# Patient Record
Sex: Female | Born: 2000 | Race: Black or African American | Hispanic: No | Marital: Single | Smoking: Never smoker
Health system: Southern US, Community
[De-identification: ages and names within clinical notes are randomized; demographics above are authoritative.]

## PROBLEM LIST (undated history)

## (undated) ENCOUNTER — Inpatient Hospital Stay (HOSPITAL_COMMUNITY): Payer: Self-pay

## (undated) DIAGNOSIS — Z789 Other specified health status: Secondary | ICD-10-CM

## (undated) HISTORY — DX: Other specified health status: Z78.9

---
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
## 2022-03-01 ENCOUNTER — Ambulatory Visit: Payer: Medicaid Other | Admitting: Obstetrics and Gynecology

## 2022-03-01 ENCOUNTER — Other Ambulatory Visit (HOSPITAL_COMMUNITY)
Admission: RE | Admit: 2022-03-01 | Discharge: 2022-03-01 | Disposition: A | Discharge status: Home / Self Care | Payer: Medicaid Other | Source: Ambulatory Visit | Attending: Obstetrics & Gynecology | Admitting: Obstetrics & Gynecology

## 2022-03-01 VITALS — BP 113/76 | HR 106 | Ht 64.0 in | Wt 111.7 lb

## 2022-03-01 DIAGNOSIS — Z113 Encounter for screening for infections with a predominantly sexual mode of transmission: Secondary | ICD-10-CM | POA: Diagnosis present

## 2022-03-01 DIAGNOSIS — N760 Acute vaginitis: Secondary | ICD-10-CM

## 2022-03-01 DIAGNOSIS — N898 Other specified noninflammatory disorders of vagina: Secondary | ICD-10-CM | POA: Insufficient documentation

## 2022-03-01 DIAGNOSIS — A749 Chlamydial infection, unspecified: Secondary | ICD-10-CM

## 2022-03-01 DIAGNOSIS — B379 Candidiasis, unspecified: Secondary | ICD-10-CM

## 2022-03-01 NOTE — Progress Notes (Signed)
Patient presents today due to yeast/ BV symptoms. She states symptoms include abnormal white thick vaginal discharge starting on 11/8. Self swab culture preformed. All questions asked, patient aware office will be in touch with results.

## 2022-03-03 LAB — CERVICOVAGINAL ANCILLARY ONLY
Bacterial Vaginitis (gardnerella): POSITIVE — AB
Candida Glabrata: NEGATIVE
Candida Vaginitis: POSITIVE — AB
Chlamydia: POSITIVE — AB
Comment: NEGATIVE
Comment: NEGATIVE
Comment: NEGATIVE
Comment: NEGATIVE
Comment: NEGATIVE
Comment: NORMAL
Neisseria Gonorrhea: NEGATIVE
Trichomonas: NEGATIVE

## 2022-03-03 NOTE — Progress Notes (Signed)
Can you pls notify pt and treat per protocols since I'm out of the office? Thx

## 2022-03-04 MED ORDER — FLUCONAZOLE 150 MG PO TABS: 150.0000 mg | ORAL_TABLET | Freq: Once | ORAL | 0 refills | Status: AC

## 2022-03-04 MED ORDER — METRONIDAZOLE 500 MG PO TABS: 500.0000 mg | ORAL_TABLET | Freq: Two times a day (BID) | ORAL | 0 refills | Status: DC

## 2022-03-04 MED ORDER — AZITHROMYCIN 500 MG PO TABS: 1000.0000 mg | ORAL_TABLET | Freq: Once | ORAL | 0 refills | Status: AC

## 2022-03-04 NOTE — Addendum Note (Signed)
Addended by: Drenda Freeze on: 03/04/2022 02:14 PM   Modules accepted: Orders

## 2022-03-09 ENCOUNTER — Telehealth: Payer: Self-pay

## 2022-03-09 NOTE — Telephone Encounter (Signed)
Patient is aware 

## 2022-03-09 NOTE — Telephone Encounter (Signed)
Pt calling to see if rx was sent to [facility] or [facility].  [phone]  Mailbox is full.  Rx went to [facility] on [address].

## 2022-06-24 ENCOUNTER — Ambulatory Visit: Payer: Medicaid Other

## 2022-06-30 ENCOUNTER — Other Ambulatory Visit (HOSPITAL_COMMUNITY)
Admission: RE | Admit: 2022-06-30 | Discharge: 2022-06-30 | Disposition: A | Discharge status: Home / Self Care | Payer: BC Managed Care – PPO | Source: Ambulatory Visit | Attending: Obstetrics & Gynecology | Admitting: Obstetrics & Gynecology

## 2022-06-30 ENCOUNTER — Ambulatory Visit: Payer: BC Managed Care – PPO

## 2022-06-30 VITALS — BP 110/70 | Ht 64.0 in | Wt 116.0 lb

## 2022-06-30 DIAGNOSIS — Z113 Encounter for screening for infections with a predominantly sexual mode of transmission: Secondary | ICD-10-CM

## 2022-06-30 NOTE — Progress Notes (Signed)
    NURSE VISIT NOTE  Subjective:    Patient ID: Pakou Rainbow, female    DOB: May 09, 2000, 22 y.o.   MRN: [id number]  HPI  Patient is a 22 y.o. G37P0010 female who presents for a nurse visit for self swab routine STD testing. Has had on and off pelvic pain for the past two weeks and a half. Has also had vaginal discharge, itching and fishy odor for two weeks and a half. Denies abnormal vaginal bleeding.  Objective:    BP 110/70   Ht 5\' 4"  (1.626 m)   Wt 116 lb (52.6 kg)   LMP 06/22/2022 (Exact Date)   BMI 19.91 kg/m    @THIS  VISIT ONLY@  Assessment:   1. Screening for STD (sexually transmitted disease)      Plan:   GC and chlamydia DNA  probe sent to lab. Treatment: Will wait for results to be treated if needed. ROV prn if symptoms persist or worsen.   Drenda Freeze, CMA

## 2022-07-02 LAB — CERVICOVAGINAL ANCILLARY ONLY
Bacterial Vaginitis (gardnerella): POSITIVE — AB
Candida Glabrata: NEGATIVE
Candida Vaginitis: NEGATIVE
Chlamydia: NEGATIVE
Comment: NEGATIVE
Comment: NEGATIVE
Comment: NEGATIVE
Comment: NEGATIVE
Comment: NEGATIVE
Comment: NORMAL
Neisseria Gonorrhea: NEGATIVE
Trichomonas: NEGATIVE

## 2022-07-04 ENCOUNTER — Other Ambulatory Visit: Payer: Self-pay | Admitting: Obstetrics and Gynecology

## 2022-07-04 MED ORDER — METRONIDAZOLE 500 MG PO TABS: ORAL_TABLET | ORAL | 0 refills | Status: DC

## 2022-07-04 NOTE — Progress Notes (Signed)
Rx flagyl for BV on culture/sx

## 2022-10-28 ENCOUNTER — Emergency Department
Admission: EM | Admit: 2022-10-28 | Discharge: 2022-10-28 | Disposition: A | Discharge status: Home / Self Care | Payer: Medicaid Other | Attending: Student in an Organized Health Care Education/Training Program | Admitting: Student in an Organized Health Care Education/Training Program

## 2022-10-28 ENCOUNTER — Ambulatory Visit: Payer: BC Managed Care – PPO

## 2022-10-28 ENCOUNTER — Other Ambulatory Visit: Payer: Self-pay

## 2022-10-28 ENCOUNTER — Encounter: Payer: Self-pay | Admitting: Emergency Medicine

## 2022-10-28 DIAGNOSIS — N946 Dysmenorrhea, unspecified: Secondary | ICD-10-CM

## 2022-10-28 DIAGNOSIS — R300 Dysuria: Secondary | ICD-10-CM

## 2022-10-28 DIAGNOSIS — R102 Pelvic and perineal pain: Secondary | ICD-10-CM | POA: Diagnosis present

## 2022-10-28 LAB — URINALYSIS, COMPLETE (UACMP) WITH MICROSCOPIC
Bilirubin Urine: NEGATIVE
Glucose, UA: NEGATIVE mg/dL
Ketones, ur: NEGATIVE mg/dL
Leukocytes,Ua: NEGATIVE
Nitrite: POSITIVE — AB
Protein, ur: 30 mg/dL — AB
RBC / HPF: >50 RBC/hpf (ref 0–5)
Specific Gravity, Urine: 1.026 (ref 1.005–1.030)
pH: 7 (ref 5.0–8.0)

## 2022-10-28 LAB — WET PREP, GENITAL
Clue Cells Wet Prep HPF POC: NONE SEEN
Sperm: NONE SEEN
Trich, Wet Prep: NONE SEEN
WBC, Wet Prep HPF POC: <10 (ref <10)
Yeast Wet Prep HPF POC: NONE SEEN

## 2022-10-28 LAB — CHLAMYDIA/NGC RT PCR (ARMC ONLY)
Chlamydia Tr: NOT DETECTED
N gonorrhoeae: NOT DETECTED

## 2022-10-28 LAB — POC URINE PREG, ED: Preg Test, Ur: NEGATIVE

## 2022-10-28 MED ORDER — SULFAMETHOXAZOLE-TRIMETHOPRIM 800-160 MG PO TABS: 1.0000 | ORAL_TABLET | Freq: Two times a day (BID) | ORAL | 0 refills | Status: AC

## 2022-10-28 NOTE — ED Triage Notes (Signed)
Patient to ED via POV for pelvic pain. Patient stating she wants STD testing- denies exposure. Also concerned because her period started earlier than normal.

## 2022-10-28 NOTE — ED Provider Notes (Signed)
[facility] Emergency Department Provider Note     Event Date/Time   First MD Initiated Contact with Patient 10/28/22 1306     (approximate)   History   Pelvic Pain   HPI  Kathleen Jenkins is a 22 y.o. female presents to the ED for evaluation of pelvic discomfort and pelvic pain.  Patient is also requesting STD exposure testing.  Patient voices concern because her period started earlier than expected.  She denies any abnormal vaginal bleeding and notes that she is currently on her menses.  Patient denies any other complaints at this time.  Physical Exam   Triage Vital Signs: ED Triage Vitals  Encounter Vitals Group     BP 10/28/22 1147 115/60     Systolic BP Percentile --      Diastolic BP Percentile --      Pulse Rate 10/28/22 1147 79     Resp 10/28/22 1147 18     Temp 10/28/22 1147 98.3 F (36.8 C)     Temp Source 10/28/22 1147 Oral     SpO2 10/28/22 1147 100 %     Weight --      Height --      Head Circumference --      Peak Flow --      Pain Score 10/28/22 1148 6     Pain Loc --      Pain Education --      Exclude from Growth Chart --     Most recent vital signs: Vitals:   10/28/22 1147  BP: 115/60  Pulse: 79  Resp: 18  Temp: 98.3 F (36.8 C)  SpO2: 100%    General Awake, no distress. NAD CV:  Good peripheral perfusion.  RESP:  Normal effort.  ABD:  No distention.  GU:  Normal external genitalia.  Patient with elongated cervix with some mild superior cervical ectopy. No CMT or adnexal masses appreciated.  ED Results / Procedures / Treatments   Labs (all labs ordered are listed, but only abnormal results are displayed) Labs Reviewed  URINALYSIS, COMPLETE (UACMP) WITH MICROSCOPIC - Abnormal; Notable for the following components:      Result Value   Color, Urine YELLOW (*)    APPearance HAZY (*)    Hgb urine dipstick MODERATE (*)    Protein, ur 30 (*)    Nitrite POSITIVE (*)    Bacteria, UA FEW (*)    All other  components within normal limits  WET PREP, GENITAL  CHLAMYDIA/NGC RT PCR ([facility] ONLY)            POC URINE PREG, ED     EKG    RADIOLOGY   PROCEDURES:  Critical Care performed: No  Procedures   MEDICATIONS ORDERED IN ED: Medications - No data to display   IMPRESSION / MDM / ASSESSMENT [place] PLAN / ED COURSE  I reviewed the triage vital signs and the nursing notes.                              Differential diagnosis includes, but is not limited to, yeast vaginitis, BV, trichomoniasis, gonorrhea, chlamydia, UTI, pregnancy, secondary amenorrhea  Patient's presentation is most consistent with acute complicated illness / injury requiring diagnostic workup.  Patient's diagnosis is consistent with dysmenorrhea likely due to normal menses as well as some mild dysuria.  Patient UA did reveal nitrite positive urine with mild bacteriuria. Patient will be discharged  home with prescriptions for Bactrim. Patient is to follow up with the [facility] department as needed or otherwise directed. Patient is given ED precautions to return to the ED for any worsening or new symptoms.     FINAL CLINICAL IMPRESSION(S) / ED DIAGNOSES   Final diagnoses:  Dysmenorrhea  Dysuria     Rx / DC Orders   ED Discharge Orders          Ordered    sulfamethoxazole-trimethoprim (BACTRIM DS) 800-160 MG tablet  2 times daily        10/28/22 1528             Note:  This document was prepared using Dragon voice recognition software and may include unintentional dictation errors.    Lissa Hoard, PA-C 10/28/22 1741    Willy Eddy, MD 10/30/22 1224

## 2022-10-28 NOTE — Discharge Instructions (Addendum)
Your exam and labs overall normal and reassuring.  You did have UA evidence of UTI.  Take the antibiotic as directed.  Follow-up with [facility] department for ongoing concerns.

## 2022-10-28 NOTE — Progress Notes (Deleted)
    NURSE VISIT NOTE  Subjective:    Patient ID: Kathleen Jenkins, female    DOB: [birth year], 22 y.o.   MRN: [id number]  HPI  Patient is a 22 y.o. G49P0010 female who presents for {pe vag discharge desc:315065} vaginal discharge for *** {gen duration:315003}. Denies abnormal vaginal bleeding or significant pelvic pain or fever. {Actions; denies/reports/admits to:19208} {UTI Symptoms:210800002}. Patient {has/denies:315300} history of known exposure to STD.   Objective:    There were no vitals taken for this visit.   @THIS  VISIT ONLY@  Assessment:   No diagnosis found.  {vaginitis type:315262}  Plan:   GC and chlamydia DNA  probe sent to lab. Treatment: {vaginitis tx:315263} ROV prn if symptoms persist or worsen.   Fonda Kinder, CMA

## 2022-11-05 ENCOUNTER — Ambulatory Visit: Payer: Medicaid Other | Admitting: Obstetrics

## 2022-11-05 DIAGNOSIS — Z01419 Encounter for gynecological examination (general) (routine) without abnormal findings: Secondary | ICD-10-CM

## 2022-12-05 IMAGING — CT CT ABD-PELV W/O CM
2 of 4 series · 16 of 46 positions shown, 18 images · non-contrast
Comparison: Ultrasound 09/23/2019

CLINICAL DATA: Acute lower abdominal pain.

EXAM:
CT ABDOMEN AND PELVIS WITHOUT CONTRAST
TECHNIQUE: Multidetector CT imaging of the abdomen and pelvis was performed
following the standard protocol without IV contrast.

[Series 2: routine abd/pel wo · axial · 0.62mm/px · z∈[-1071,-666]mm · 13 of 89 slices shown, 15 images]
[im 4/89  soft-tissue]
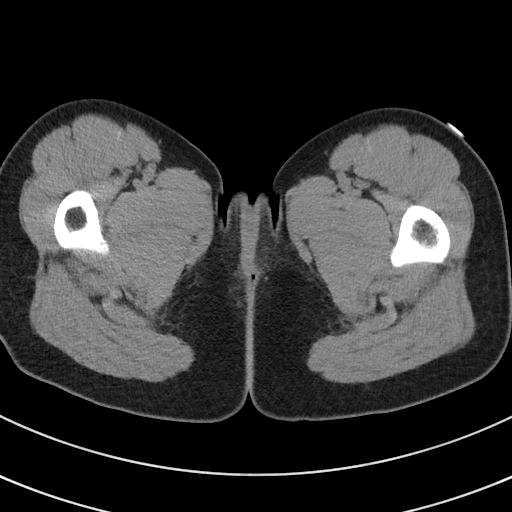
[im 4/89  bone]
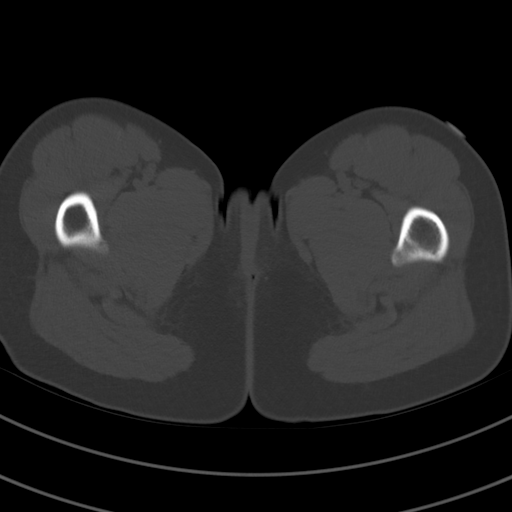
[im 12/89  soft-tissue]
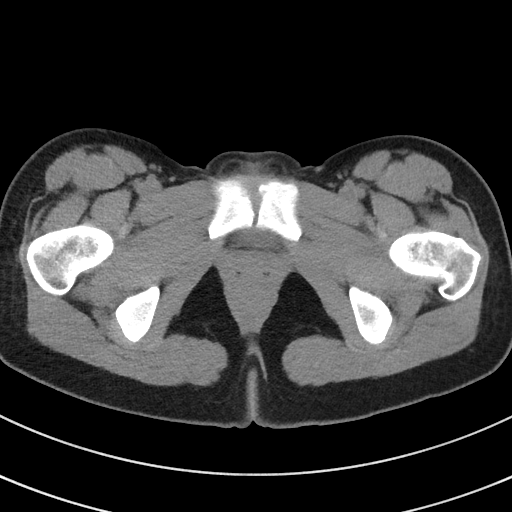
[im 19/89  soft-tissue]
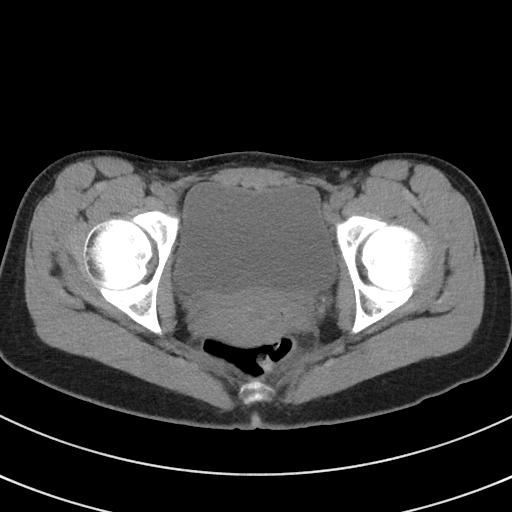
[im 26/89  soft-tissue]
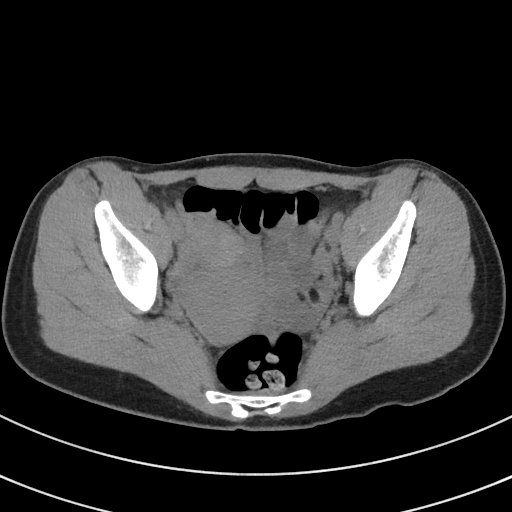
[im 30/89  soft-tissue]
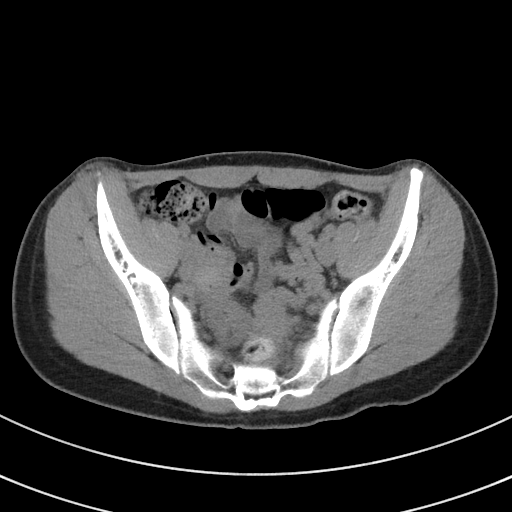
[im 37/89  soft-tissue]
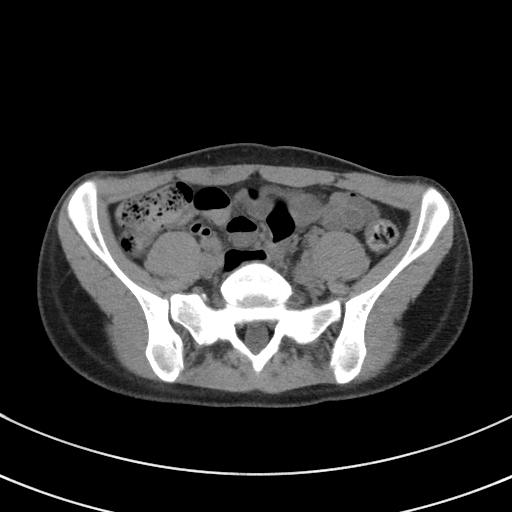
[im 45/89  soft-tissue]
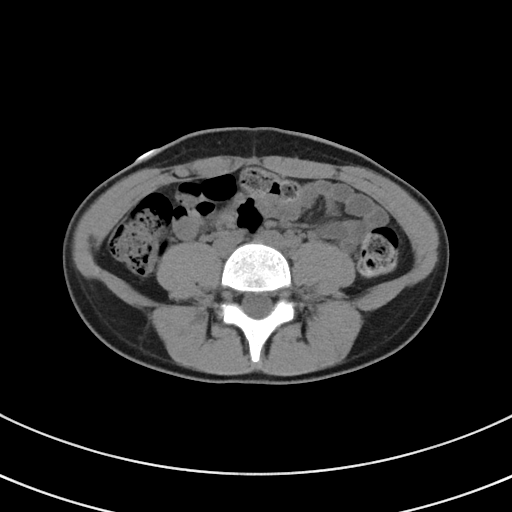
[im 52/89  soft-tissue]
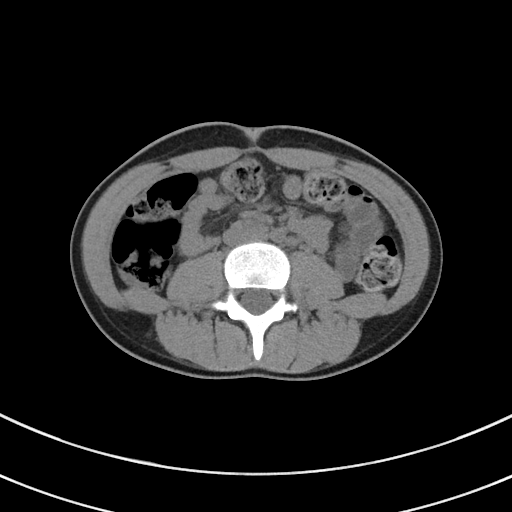
[im 59/89  soft-tissue]
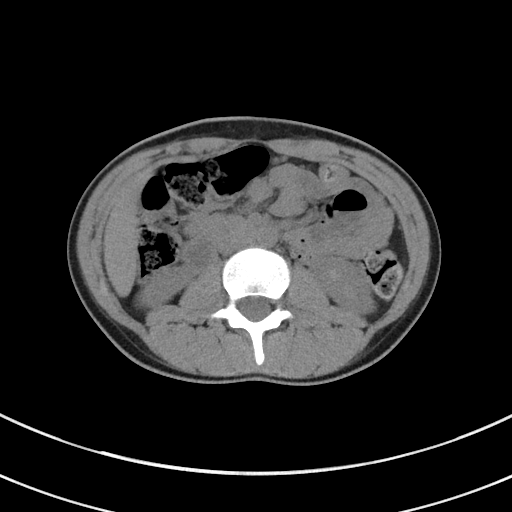
[im 59/89  bone]
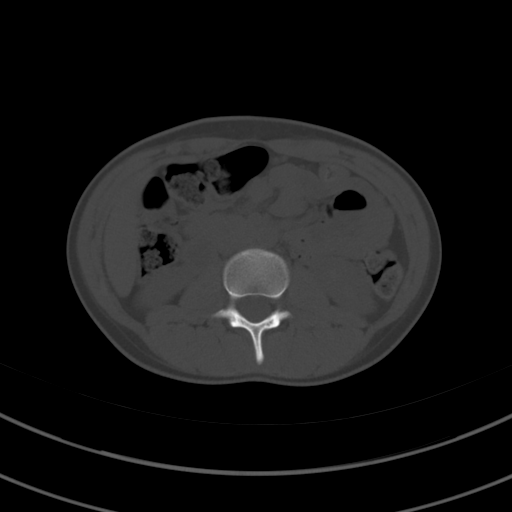
[im 63/89  soft-tissue]
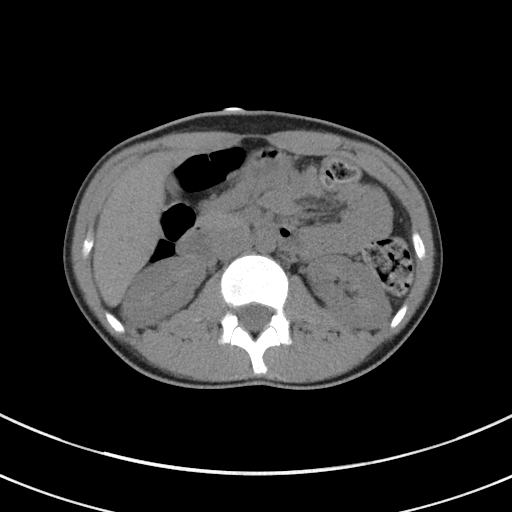
[im 70/89  soft-tissue]
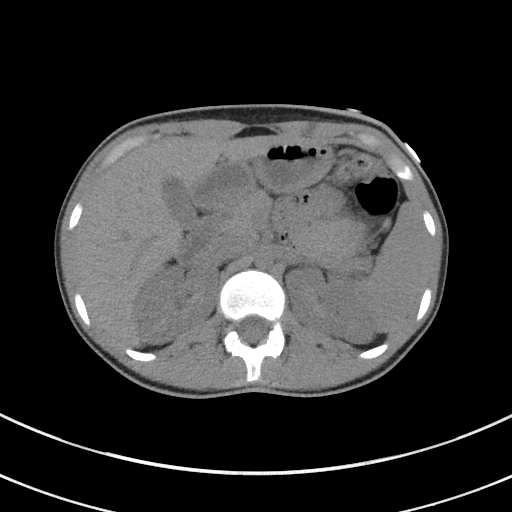
[im 78/89  soft-tissue]
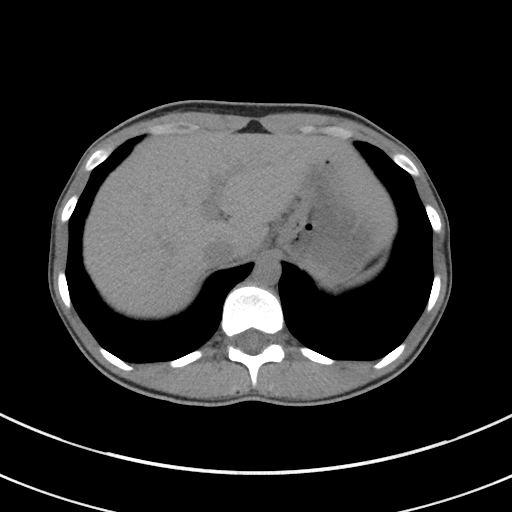
[im 85/89  soft-tissue]
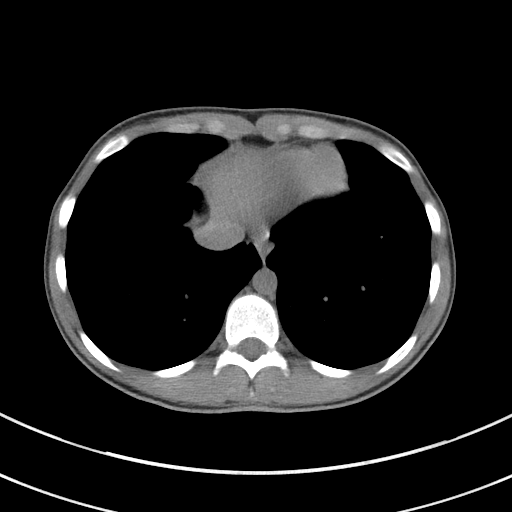

[Series 4: coronal st · coronal · 0.66mm/px · 3 of 59 slices shown]
[im 20/59  soft-tissue]
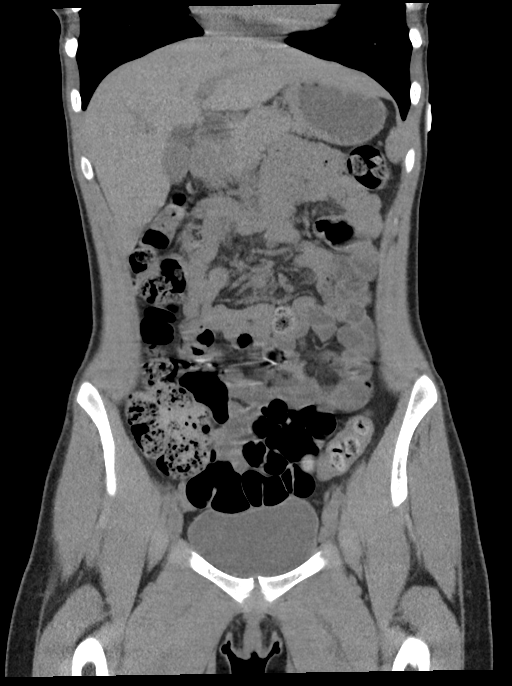
[im 26/59  soft-tissue]
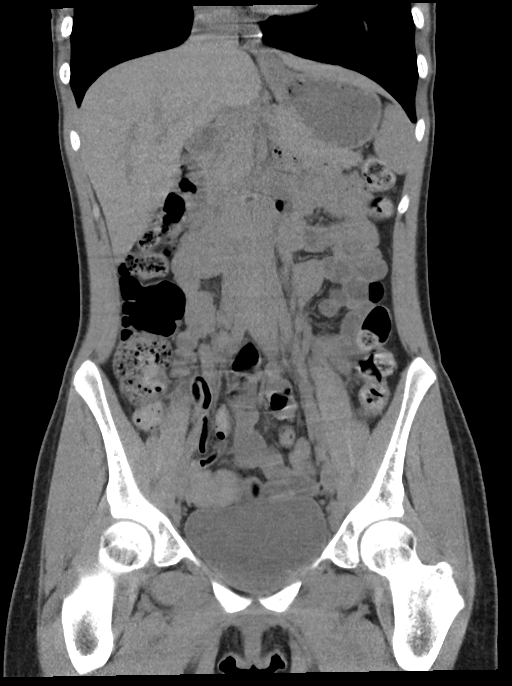
[im 33/59  soft-tissue]
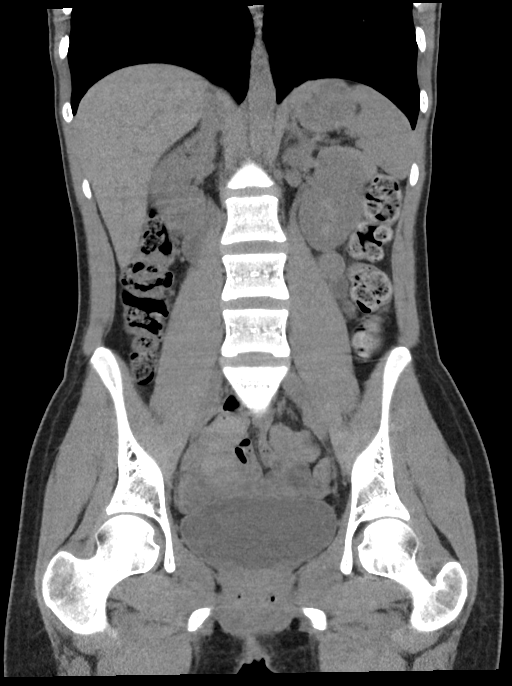

[16 of 46 positions shown; findings below may reference images not displayed]

FINDINGS: Lower chest: The lung bases are clear of acute process. No pleural
effusion or pulmonary lesions. The heart is normal in size. No
pericardial effusion. The distal esophagus and aorta are
unremarkable.

Hepatobiliary: No focal hepatic lesions or intrahepatic biliary
dilatation. The gallbladder is normal. No common bile duct
dilatation.

Pancreas: No mass, inflammation or ductal dilatation.

Spleen: Normal size.  No focal lesions.

Adrenals/Urinary Tract: The adrenal glands and kidneys are
unremarkable. No renal calculi or hydronephrosis. The bladder is
unremarkable.

Stomach/Bowel: The stomach, duodenum, small bowel and colon are
grossly normal without oral contrast. No inflammatory changes, mass
lesions or obstructive findings. The appendix is normal.

Vascular/Lymphatic: The aorta is normal in caliber. No
atheroscerlotic calcifications. No mesenteric of retroperitoneal
mass or adenopathy. Small scattered lymph nodes are noted.

Reproductive: The uterus is unremarkable.

The right ovary appears normal.

There is a complex left ovarian/adnexal lesion which contains simple
appearing fluid and also several fatty lobules. Findings suspicious
for ovarian dermoid. MRI examination may be helpful for further
evaluation if clinically necessary.

Other: No free pelvic fluid collections or pelvic abscess.

Musculoskeletal: The bony structures are unremarkable.
IMPRESSION: 1. Complex left ovarian/adnexal lesion suspicious for ovarian
dermoid. MRI may be helpful for further evaluation.
2. No acute abdominal/pelvic findings, other mass lesions or
adenopathy.

## 2022-12-11 ENCOUNTER — Emergency Department (HOSPITAL_COMMUNITY)
Admission: EM | Admit: 2022-12-11 | Discharge: 2022-12-11 | Disposition: A | Discharge status: Home / Self Care | Payer: Medicaid Other | Attending: Emergency Medicine | Admitting: Emergency Medicine

## 2022-12-11 ENCOUNTER — Emergency Department (HOSPITAL_COMMUNITY): Payer: Medicaid Other

## 2022-12-11 ENCOUNTER — Other Ambulatory Visit: Payer: Self-pay

## 2022-12-11 DIAGNOSIS — S01511A Laceration without foreign body of lip, initial encounter: Secondary | ICD-10-CM | POA: Diagnosis not present

## 2022-12-11 DIAGNOSIS — S0993XA Unspecified injury of face, initial encounter: Secondary | ICD-10-CM | POA: Diagnosis present

## 2022-12-11 DIAGNOSIS — R519 Headache, unspecified: Secondary | ICD-10-CM | POA: Diagnosis not present

## 2022-12-11 MED ORDER — MORPHINE SULFATE (PF) 4 MG/ML IV SOLN
4.0000 mg | Freq: Once | INTRAVENOUS | Status: AC
  Administered 2022-12-11: 4 mg via INTRAMUSCULAR
  Filled 2022-12-11: qty 1

## 2022-12-11 MED ORDER — OXYCODONE HCL 5 MG PO TABS
5.0000 mg | ORAL_TABLET | Freq: Four times a day (QID) | ORAL | 0 refills | Status: DC | PRN
Start: 2022-12-11 — End: 2022-12-31

## 2022-12-11 MED ORDER — LIDOCAINE HCL (PF) 1 % IJ SOLN
2.0000 mL | Freq: Once | INTRAMUSCULAR | Status: AC
  Administered 2022-12-11: 2 mL via INTRADERMAL
  Filled 2022-12-11: qty 5

## 2022-12-11 NOTE — Discharge Instructions (Addendum)
Take the pain medication as needed.  It will make you drowsy so do not take before driving or operating machinery.  You can alternate as needed with Motrin and Tylenol.  Keep your laceration clean. Eat soft foods while healing.  Rinse her mouth out after eating.  The sutures should dissolve.  Monitor for any signs of worsening including increased redness, swelling, drainage, or pain.  Follow-up with a dentist as soon as possible.  Call Dr. Mart Piggs office as soon as possible.  If they states you need an oral surgeon, additional oral surgeons are listed above.  Monitor for any signs of worsening including increased pain, drainage, loosening of the teeth.  Return to the ED for any worsening of symptoms or further concerns.
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
## 2022-12-31 ENCOUNTER — Encounter: Payer: Self-pay | Admitting: Obstetrics

## 2022-12-31 ENCOUNTER — Ambulatory Visit (INDEPENDENT_AMBULATORY_CARE_PROVIDER_SITE_OTHER): Payer: Medicaid Other | Admitting: Obstetrics

## 2022-12-31 ENCOUNTER — Other Ambulatory Visit (HOSPITAL_COMMUNITY)
Admission: RE | Admit: 2022-12-31 | Discharge: 2022-12-31 | Disposition: A | Discharge status: Home / Self Care | Payer: Medicaid Other | Source: Ambulatory Visit | Attending: Obstetrics | Admitting: Obstetrics

## 2022-12-31 VITALS — BP 117/81 | HR 72 | Ht 64.0 in | Wt 116.4 lb

## 2022-12-31 DIAGNOSIS — Z01419 Encounter for gynecological examination (general) (routine) without abnormal findings: Secondary | ICD-10-CM | POA: Diagnosis not present

## 2022-12-31 DIAGNOSIS — R399 Unspecified symptoms and signs involving the genitourinary system: Secondary | ICD-10-CM

## 2022-12-31 DIAGNOSIS — Z113 Encounter for screening for infections with a predominantly sexual mode of transmission: Secondary | ICD-10-CM | POA: Insufficient documentation

## 2022-12-31 LAB — POCT URINALYSIS DIPSTICK
Bilirubin, UA: NEGATIVE
Blood, UA: NEGATIVE
Glucose, UA: NEGATIVE
Ketones, UA: NEGATIVE
Leukocytes, UA: NEGATIVE
Nitrite, UA: NEGATIVE
Protein, UA: NEGATIVE
Spec Grav, UA: 1.025 (ref 1.010–1.025)
Urobilinogen, UA: 0.2 U/dL
pH, UA: 6.5 (ref 5.0–8.0)
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
## 2023-01-01 LAB — COMPREHENSIVE METABOLIC PANEL
ALT: 15 IU/L (ref 0–32)
AST: 15 IU/L (ref 0–40)
Albumin: 4 g/dL (ref 4.0–5.0)
Alkaline Phosphatase: 62 IU/L (ref 42–106)
BUN/Creatinine Ratio: 11 (ref 9–23)
BUN: 8 mg/dL (ref 6–20)
Bilirubin Total: 0.3 mg/dL (ref 0.0–1.2)
CO2: 23 mmol/L (ref 20–29)
Calcium: 9.2 mg/dL (ref 8.7–10.2)
Chloride: 108 mmol/L — ABNORMAL HIGH (ref 96–106)
Creatinine, Ser: 0.71 mg/dL (ref 0.57–1.00)
Globulin, Total: 2.7 g/dL (ref 1.5–4.5)
Glucose: 88 mg/dL (ref 70–99)
Potassium: 4.5 mmol/L (ref 3.5–5.2)
Sodium: 142 mmol/L (ref 134–144)
Total Protein: 6.7 g/dL (ref 6.0–8.5)
eGFR: 126 mL/min/{1.73_m2} (ref >59)

## 2023-01-01 LAB — HEPATITIS B SURFACE ANTIBODY,QUALITATIVE: Hep B Surface Ab, Qual: REACTIVE

## 2023-01-01 LAB — TSH: TSH: 0.835 u[IU]/mL (ref 0.450–4.500)

## 2023-01-01 LAB — HEMOGLOBIN A1C
Est. average glucose Bld gHb Est-mCnc: 103 mg/dL
Hgb A1c MFr Bld: 5.2 % (ref 4.8–5.6)

## 2023-01-01 LAB — CBC
Hematocrit: 34.8 % (ref 34.0–46.6)
Hemoglobin: 11 g/dL — ABNORMAL LOW (ref 11.1–15.9)
MCH: 29.4 pg (ref 26.6–33.0)
MCHC: 31.6 g/dL (ref 31.5–35.7)
MCV: 93 fL (ref 79–97)
Platelets: 376 10*3/uL (ref 150–450)
RBC: 3.74 x10E6/uL — ABNORMAL LOW (ref 3.77–5.28)
RDW: 12.4 % (ref 11.7–15.4)
WBC: 8 10*3/uL (ref 3.4–10.8)

## 2023-01-01 LAB — LIPID PANEL
Chol/HDL Ratio: 2.3 ratio (ref 0.0–4.4)
Cholesterol, Total: 146 mg/dL (ref 100–169)
HDL: 63 mg/dL (ref >39)
LDL Chol Calc (NIH): 72 mg/dL (ref 0–109)
Triglycerides: 48 mg/dL (ref 0–89)
VLDL Cholesterol Cal: 11 mg/dL (ref 5–40)

## 2023-01-01 LAB — HEPATITIS C ANTIBODY: Hep C Virus Ab: NONREACTIVE

## 2023-01-01 LAB — HEPATITIS B SURFACE ANTIGEN: Hepatitis B Surface Ag: NEGATIVE

## 2023-01-01 LAB — HIV ANTIBODY (ROUTINE TESTING W REFLEX): HIV Screen 4th Generation wRfx: NONREACTIVE

## 2023-01-01 LAB — RPR: RPR Ser Ql: NONREACTIVE

## 2023-01-03 ENCOUNTER — Other Ambulatory Visit: Payer: Self-pay | Admitting: Obstetrics

## 2023-01-03 ENCOUNTER — Encounter: Payer: Self-pay | Admitting: Obstetrics

## 2023-01-03 LAB — CERVICOVAGINAL ANCILLARY ONLY
Bacterial Vaginitis (gardnerella): POSITIVE — AB
Candida Glabrata: NEGATIVE
Candida Vaginitis: NEGATIVE
Chlamydia: NEGATIVE
Comment: NEGATIVE
Comment: NEGATIVE
Comment: NEGATIVE
Comment: NEGATIVE
Comment: NEGATIVE
Comment: NORMAL
Neisseria Gonorrhea: NEGATIVE
Trichomonas: POSITIVE — AB

## 2023-01-03 MED ORDER — METRONIDAZOLE 500 MG PO TABS: 500.0000 mg | ORAL_TABLET | Freq: Two times a day (BID) | ORAL | 0 refills | Status: DC

## 2023-01-03 NOTE — Progress Notes (Signed)
+  BV and trichomoniasis. Rx for metronidazole 500 mg PO BID x 7 days sent to pharmacy. [place] notified via MyChart.  M. Chryl Heck, CNM

## 2023-01-04 LAB — URINE CULTURE

## 2023-01-10 ENCOUNTER — Other Ambulatory Visit: Payer: Self-pay | Admitting: Obstetrics

## 2023-01-10 ENCOUNTER — Encounter: Payer: Self-pay | Admitting: Obstetrics

## 2023-01-10 MED ORDER — NITROFURANTOIN MONOHYD MACRO 100 MG PO CAPS: 100.0000 mg | ORAL_CAPSULE | Freq: Two times a day (BID) | ORAL | 0 refills | Status: DC

## 2023-01-24 NOTE — Telephone Encounter (Signed)
Patient called to inquire about taking macrobid and flagyl at the same time. Per Dr. Lonny Prude, it's ok, if  she prefers, she can take the macrobid first and then take the flagyl when macrobid is complete. Patient advised she has already taken both together. No further questions at this time.

## 2023-09-28 ENCOUNTER — Ambulatory Visit: Payer: Self-pay

## 2023-09-28 NOTE — Telephone Encounter (Signed)
 FYI Only or Action Required?: FYI only for provider  Patient was last seen in primary care on na Called Nurse Triage reporting Cough. Symptoms began a week ago. Interventions attempted: Nothing. Symptoms are: stable.  Triage Disposition: No disposition on file.  Patient/caregiver understands and will follow disposition?:     Copied from CRM [id number]. Topic: Clinical - Red Word Triage >> Sep 28, 2023  8:47 AM Kathleen Jenkins wrote: [facility] that prompted transfer to Nurse Triage:  Cough with mucous,  bumps in throat, difficulty swallowing Reason for Disposition  SEVERE coughing spells (e.g., whooping sound after coughing, vomiting after coughing)  Answer Assessment - Initial Assessment Questions 1. ONSET: When did the cough begin?      About a week ago 2. SEVERITY: How bad is the cough today?      severe 3. SPUTUM: Describe the color of your sputum (none, dry cough; clear, white, yellow, green)     Unknown, states swallows it 4. HEMOPTYSIS: Are you coughing up any blood? If so ask: How much? (flecks, streaks, tablespoons, etc.)     denies 5. DIFFICULTY BREATHING: Are you having difficulty breathing? If Yes, ask: How bad is it? (e.g., mild, moderate, severe)    - MILD: No SOB at rest, mild SOB with walking, speaks normally in sentences, can lie down, no retractions, pulse < 100.    - MODERATE: SOB at rest, SOB with minimal exertion and prefers to sit, cannot lie down flat, speaks in phrases, mild retractions, audible wheezing, pulse 100-120.    - SEVERE: Very SOB at rest, speaks in single words, struggling to breathe, sitting hunched forward, retractions, pulse > 120      denies 6. FEVER: Do you have a fever? If Yes, ask: What is your temperature, how was it measured, and when did it start?     no 7. CARDIAC HISTORY: Do you have any history of heart disease? (e.g., heart attack, congestive heart failure)      no 8. LUNG HISTORY: Do you have any history of lung disease?   (e.g., pulmonary embolus, asthma, emphysema)     no 9. PE RISK FACTORS: Do you have a history of blood clots? (or: recent major surgery, recent prolonged travel, bedridden)     no 10. OTHER SYMPTOMS: Do you have any other symptoms? (e.g., runny nose, wheezing, chest pain)       Sore throat trouble swallowing, itchy 11. PREGNANCY: Is there any chance you are pregnant? When was your last menstrual period?       na 12. TRAVEL: Have you traveled out of the country in the last month? (e.g., travel history, exposures)       no  Protocols used: Cough - Acute Productive-A-AH

## 2023-10-18 NOTE — Progress Notes (Deleted)
 Subjective:     Patient ID: Kathleen Jenkins, female    DOB: 09-20-2000, 23 y.o.   MRN: [id number]  No chief complaint on file.   HPI  Kathleen Jenkins is a 23 year old patient is presents for acute visit   UPPER RESPIRATORY INFECTION  Onset: ***  Course: *** Better with: *** Meds tried: *** Sick contacts: ***  Nasal discharge (color,laterality): ***  Cough: {YES/NO/WILD CARDS:18581} Productive cough: {YES/NO/WILD CARDS:18581}, Color: ***  Sinusitis Risk Factors Fever:    Headache/face pain: {YES/NO/WILD CARDS:18581}  Double sickening: {YES/NO/WILD CARDS:18581}  Tooth pain: {YES/NO/WILD CARDS:18581}   Allergy Risk Factors: Sneezing: {YES/NO/WILD CARDS:18581}  Itchy scratchy throat: {YES/NO/WILD CARDS:18581}  Seasonal sx: {YES/NO/WILD CARDS:18581}   Flu Risk Factors Headache: {YES/NO/WILD CARDS:18581}  Muscle aches: {YES/NO/WILD CARDS:18581}  Severe fatigue: {YES/NO/WILD CARDS:18581}    Red Flags  Stiff neck: {YES/NO/WILD CARDS:18581}  Dyspnea: {YES/NO/WILD CARDS:18581}  Rash: {YES/NO/WILD CARDS:18581}  Swallowing difficulty: {YES/NO/WILD RJMID:81418}    Patient denies fever, chills, SOB, CP, palpitations, dyspnea, edema, HA, vision changes, N/V/D, abdominal pain, urinary symptoms, rash, weight changes, and recent illness or hospitalizations.    History of Present Illness              Health Maintenance Due  Topic Date Due  . HPV VACCINES (1 - 3-dose series) Never done  . Meningococcal B Vaccine (1 of 2 - Standard) Never done  . DTaP/Tdap/Td (1 - Tdap) Never done  . Hepatitis B Vaccines (1 of 3 - 19+ 3-dose series) Never done  . COVID-19 Vaccine (1 - 2024-25 season) Never done    Past Medical History:  Diagnosis Date  . Antihistamines overdose 10/08/2020  . Chlamydia 08/12/2021  . No pertinent past medical history     Past Surgical History:  Procedure Laterality Date  . LAPAROSCOPIC OVARIAN CYSTECTOMY Left 10/30/2017   Procedure: LAPAROSCOPIC  OVARIAN CYSTECTOMY;  Surgeon: Lake Read, MD;  Location: [facility];  Service: Gynecology;  Laterality: Left;  . LAPAROSCOPIC UNILATERAL SALPINGECTOMY Left 10/30/2017   Procedure: LAPAROSCOPIC UNILATERAL SALPINGECTOMY;  Surgeon: Lake Read, MD;  Location: [facility];  Service: Gynecology;  Laterality: Left;    No family history on file.  Social History   Socioeconomic History  . Marital status: Single    Spouse name: Not on file  . Number of children: Not on file  . Years of education: Not on file  . Highest education level: Not on file  Occupational History  . Not on file  Tobacco Use  . Smoking status: Never  . Smokeless tobacco: Never  Vaping Use  . Vaping status: Every Day  Substance and Sexual Activity  . Alcohol use: No  . Drug use: Not Currently    Types: Marijuana  . Sexual activity: Yes    Birth control/protection: None  Other Topics Concern  . Not on file  Social History Narrative  . Not on file   Social Drivers of Health   Financial Resource Strain: Not on file  Food Insecurity: Not on file  Transportation Needs: Not on file  Physical Activity: Not on file  Stress: Not on file  Social Connections: Not on file  Intimate Partner Violence: Not on file    Outpatient Medications Prior to Visit  Medication Sig Dispense Refill  . nitrofurantoin , macrocrystal-monohydrate, (MACROBID ) 100 MG capsule Take 1 capsule (100 mg total) by mouth 2 (two) times daily. 10 capsule 0  . metroNIDAZOLE  (FLAGYL ) 500 MG tablet Take 1 tablet (500 mg total) by mouth 2 (two) times daily. 14  tablet 0   No facility-administered medications prior to visit.    No Known Allergies  ROS See HPI    Objective:    Physical Exam  HEENT: - Head: Normocephalic, atraumatic. - Eyes: Conjunctivae clear, no scleral icterus. - Ears: Tympanic membranes clear bilaterally, no bulging or erythema. - Nose: Nasal mucosa mildly erythematous, congested; no purulent discharge. - Throat:  Mucosa moist. ***Mild to moderate erythema of the posterior pharynx. ***No tonsillar exudate or swelling. Uvula midline. No oral ulcers. - Neck: Supple. No lymphadenopathy or neck stiffness. No thyromegaly or tenderness.  Respiratory: Lungs clear to auscultation bilaterally. No wheezing, rales, or rhonchi. No respiratory distress. Normal work of breathing.  Cardiovascular: Regular rate and rhythm. No murmurs, rubs, or gallops.  Skin: Warm, dry. No rash.  Neurological: Alert and oriented x3. No focal deficits.   There were no vitals taken for this visit. Wt Readings from Last 3 Encounters:  12/31/22 116 lb 6.4 oz (52.8 kg) (27%, Z= -0.63)*  06/30/22 116 lb (52.6 kg) (27%, Z= -0.61)*  03/01/22 111 lb 11.2 oz (50.7 kg) (20%, Z= -0.84)*   * Growth percentiles are based on CDC (Girls, 2-20 Years) data.       Assessment & Plan:   Problem List Items Addressed This Visit   None   I am having Kathleen Jenkins maintain her metroNIDAZOLE  and nitrofurantoin  (macrocrystal-monohydrate).  No orders of the defined types were placed in this encounter.

## 2023-10-19 ENCOUNTER — Ambulatory Visit: Admitting: Student

## 2024-02-05 NOTE — Progress Notes (Unsigned)
   PCP:  Pcp, No   No chief complaint on file.    HPI:      [name] is a 23 y.o. G1P0010 whose LMP was No LMP recorded., presents today for her annual examination.  Her menses are {norm/abn:715}, lasting {number: 22536} days.  Dysmenorrhea {dysmen:716}. She {does:18564} have intermenstrual bleeding.  Sex activity: {sex active: 315163}. No pain/bleeding/dryness. Last Pap: N/A due to age Hx of STDs: chlamydia, trichomonas in past Hx of recurrent BV  There is no FH of breast cancer. There is no FH of ovarian cancer. The patient {does:18564} do self-breast exams.  Tobacco use: {tob:20664} Alcohol use: {Alcohol:11675} No drug use.  Exercise: {exercise:31265}  She {does:18564} get adequate calcium and Vitamin D in her diet.  Patient Active Problem List   Diagnosis Date Noted   MDD (major depressive disorder), recurrent episode, severe (HCC) 10/14/2020   DMDD (disruptive mood dysregulation disorder) 10/09/2020   Severe recurrent major depression without psychotic features (HCC) 10/08/2020   BV (bacterial vaginosis) 06/19/2019    Past Surgical History:  Procedure Laterality Date   LAPAROSCOPIC OVARIAN CYSTECTOMY Left 10/30/2017   Procedure: LAPAROSCOPIC OVARIAN CYSTECTOMY;  Surgeon: Lake Read, MD;  Location: [facility];  Service: Gynecology;  Laterality: Left;   LAPAROSCOPIC UNILATERAL SALPINGECTOMY Left 10/30/2017   Procedure: LAPAROSCOPIC UNILATERAL SALPINGECTOMY;  Surgeon: Lake Read, MD;  Location: [facility];  Service: Gynecology;  Laterality: Left;    No family history on file.  Social History   Socioeconomic History   Marital status: Single    Spouse name: Not on file   Number of children: Not on file   Years of education: Not on file   Highest education level: Not on file  Occupational History   Not on file  Tobacco Use   Smoking status: Never   Smokeless tobacco: Never  Vaping Use   Vaping status: Every Day  Substance and Sexual Activity    Alcohol use: No   Drug use: Not Currently    Types: Marijuana   Sexual activity: Yes    Birth control/protection: None  Other Topics Concern   Not on file  Social History Narrative   Not on file   Social Drivers of Health   Financial Resource Strain: Not on file  Food Insecurity: Not on file  Transportation Needs: Not on file  Physical Activity: Not on file  Stress: Not on file  Social Connections: Not on file  Intimate Partner Violence: Not on file     Current Outpatient Medications:    nitrofurantoin , macrocrystal-monohydrate, (MACROBID ) 100 MG capsule, Take 1 capsule (100 mg total) by mouth 2 (two) times daily., Disp: 10 capsule, Rfl: 0   metroNIDAZOLE  (FLAGYL ) 500 MG tablet, Take 1 tablet (500 mg total) by mouth 2 (two) times daily., Disp: 14 tablet, Rfl: 0     ROS:  Review of Systems BREAST: No symptoms   Objective: There were no vitals taken for this visit.   OBGyn Exam  Results: No results found for this or any previous visit (from the past 24 hours).  Assessment/Plan: No diagnosis found.  No orders of the defined types were placed in this encounter.            GYN counsel {counseling: 16159}     F/U  No follow-ups on file.  Randie Tallarico B. Jaret Coppedge, PA-C 02/05/2024 7:40 PM

## 2024-02-06 ENCOUNTER — Ambulatory Visit (INDEPENDENT_AMBULATORY_CARE_PROVIDER_SITE_OTHER): Admitting: Obstetrics and Gynecology

## 2024-02-06 ENCOUNTER — Encounter: Payer: Self-pay | Admitting: Obstetrics and Gynecology

## 2024-02-06 VITALS — BP 106/71 | HR 96 | Ht 64.0 in | Wt 136.0 lb

## 2024-02-06 DIAGNOSIS — N898 Other specified noninflammatory disorders of vagina: Secondary | ICD-10-CM | POA: Diagnosis not present

## 2024-02-06 DIAGNOSIS — Z01419 Encounter for gynecological examination (general) (routine) without abnormal findings: Secondary | ICD-10-CM

## 2024-02-06 DIAGNOSIS — Z01411 Encounter for gynecological examination (general) (routine) with abnormal findings: Secondary | ICD-10-CM

## 2024-02-06 DIAGNOSIS — Z113 Encounter for screening for infections with a predominantly sexual mode of transmission: Secondary | ICD-10-CM

## 2024-02-06 LAB — POCT WET PREP WITH KOH
Clue Cells Wet Prep HPF POC: NEGATIVE
KOH Prep POC: NEGATIVE
Trichomonas, UA: NEGATIVE
Yeast Wet Prep HPF POC: NEGATIVE

## 2024-02-06 MED ORDER — FLUCONAZOLE 150 MG PO TABS: 150.0000 mg | ORAL_TABLET | Freq: Once | ORAL | 0 refills | Status: AC

## 2024-02-06 NOTE — Patient Instructions (Addendum)
I value your feedback and you entrusting us with your care. If you get a [facility] patient survey, I would appreciate you taking the time to let us know about your experience today. Thank you!  HEALTHY VAGINAL HYGIENE  AVOID   Panytyhose Synthetic underwear (wear COTTON underwear)  Tight pants/jeans Thongs Pantyliners Scented soaps/shower gels (use Dove Sensitive Skin soap or water to clean) Bubble bath/bath bombs Scented detergents  ALL dryer sheets (line dry underwear if using them on your other clothing) Feminine sprays/douches   FOR RECURRENT BACTERIAL VAGINOSIS (BV) Above recommendations and ADD probiotics daily, USE CONDOMS  Visit [email or web address]    

## 2024-02-09 ENCOUNTER — Ambulatory Visit: Payer: Self-pay | Admitting: Obstetrics and Gynecology

## 2024-02-09 LAB — NUSWAB VAGINITIS PLUS (VG+)
Candida albicans, NAA: POSITIVE — AB
Candida glabrata, NAA: NEGATIVE
Chlamydia trachomatis, NAA: NEGATIVE
Neisseria gonorrhoeae, NAA: NEGATIVE
Trich vag by NAA: NEGATIVE

## 2024-02-19 ENCOUNTER — Emergency Department
Admission: EM | Admit: 2024-02-19 | Discharge: 2024-02-19 | Disposition: A | Discharge status: Home / Self Care | Attending: Emergency Medicine | Admitting: Emergency Medicine

## 2024-02-19 DIAGNOSIS — Z3201 Encounter for pregnancy test, result positive: Secondary | ICD-10-CM | POA: Diagnosis present

## 2024-02-19 LAB — POC URINE PREG, ED: Preg Test, Ur: POSITIVE — AB

## 2024-02-19 NOTE — ED Triage Notes (Signed)
 Pt presents to the ED via POV from home.Pt reports first day of LMP 01/23/2023. Pt states that she took a positive pregnancy test today and wanted to get a US  for confirmation. Pt denies bleeding.

## 2024-02-19 NOTE — ED Provider Notes (Signed)
 [facility] Provider Note    Event Date/Time   First MD Initiated Contact with Patient 02/19/24 1657     (approximate)   History   Chief Complaint Possible Pregnancy   HPI  Kathleen Jenkins is a 23 y.o. female with past medical history of ectopic pregnancy who presents to the ED complaining of possible pregnancy.  Patient reports that she had a positive pregnancy test earlier today and wanted to come to the ED to confirm.  She is primarily concerned because of her history of ectopic pregnancy requiring surgery in 2018, but she denies any abdominal pain or vaginal bleeding today.  She has been feeling well and states her LMP was on October 6.  She was unable to reach her OB/GYN's office today because they are closed for the weekend.     Physical Exam   Triage Vital Signs: ED Triage Vitals  Encounter Vitals Group     BP 02/19/24 1612 (!) 140/91     Girls Systolic BP Percentile --      Girls Diastolic BP Percentile --      Boys Systolic BP Percentile --      Boys Diastolic BP Percentile --      Pulse Rate 02/19/24 1612 (!) 115     Resp 02/19/24 1612 18     Temp 02/19/24 1612 97.8 F (36.6 C)     Temp Source 02/19/24 1612 Oral     SpO2 02/19/24 1612 98 %     Weight 02/19/24 1613 136 lb (61.7 kg)     Height 02/19/24 1613 5' 4 (1.626 m)     Head Circumference --      Peak Flow --      Pain Score 02/19/24 1613 0     Pain Loc --      Pain Education --      Exclude from Growth Chart --     Most recent vital signs: Vitals:   02/19/24 1612 02/19/24 1713  BP: (!) 140/91   Pulse: (!) 115   Resp: 18   Temp: 97.8 F (36.6 C)   SpO2: 98% 98%    Constitutional: Alert and oriented. Eyes: Conjunctivae are normal. Head: Atraumatic. Nose: No congestion/rhinnorhea. Mouth/Throat: Mucous membranes are moist.  Cardiovascular: Normal rate, regular rhythm. Grossly normal heart sounds.  2+ radial pulses bilaterally. Respiratory: Normal respiratory effort.   No retractions. Lungs CTAB. Gastrointestinal: Soft and nontender. No distention. Musculoskeletal: No lower extremity tenderness nor edema.  Neurologic:  Normal speech and language. No gross focal neurologic deficits are appreciated.    ED Results / Procedures / Treatments   Labs (all labs ordered are listed, but only abnormal results are displayed) Labs Reviewed  POC URINE PREG, ED - Abnormal; Notable for the following components:      Result Value   Preg Test, Ur POSITIVE (*)    All other components within normal limits    PROCEDURES:  Critical Care performed: No  Procedures   MEDICATIONS ORDERED IN ED: Medications - No data to display   IMPRESSION / MDM / ASSESSMENT AND PLAN / ED COURSE  I reviewed the triage vital signs and the nursing notes.                              23 y.o. female G58P0010 with past medical history of ectopic pregnancy who presents to the ED complaining of positive pregnancy test at home.  Patient's  presentation is most consistent with acute, uncomplicated illness.  Differential diagnosis includes, but is not limited to, early pregnancy, IUP, ectopic pregnancy.  Patient well-appearing and in no acute distress, vital signs are unremarkable.  She currently denies any complaints, states she is only here to make sure everything is okay given her history of ectopic pregnancy.  Given benign abdominal exam with no vaginal bleeding, no indication for pelvic ultrasound at this time.  We did confirm positive pregnancy test and patient is appropriate for outpatient follow-up with OB/GYN.  She was counseled to return to the ED if she were to develop any pain, bleeding, or any other concerning symptoms.  Patient agrees with plan.      FINAL CLINICAL IMPRESSION(S) / ED DIAGNOSES   Final diagnoses:  Pregnancy confirmed by positive urine test     Rx / DC Orders   ED Discharge Orders     None        Note:  This document was prepared using Dragon  voice recognition software and may include unintentional dictation errors.   Willo Dunnings, MD 02/19/24 1714

## 2024-02-23 ENCOUNTER — Other Ambulatory Visit (HOSPITAL_COMMUNITY)
Admission: RE | Admit: 2024-02-23 | Discharge: 2024-02-23 | Disposition: A | Discharge status: Home / Self Care | Source: Ambulatory Visit | Attending: Certified Nurse Midwife | Admitting: Certified Nurse Midwife

## 2024-02-23 ENCOUNTER — Ambulatory Visit (INDEPENDENT_AMBULATORY_CARE_PROVIDER_SITE_OTHER)

## 2024-02-23 VITALS — BP 118/79 | HR 101 | Wt 136.4 lb

## 2024-02-23 DIAGNOSIS — N898 Other specified noninflammatory disorders of vagina: Secondary | ICD-10-CM

## 2024-02-23 NOTE — Progress Notes (Signed)
    NURSE VISIT NOTE  Subjective:    Patient ID: Fallon Haecker, female    DOB: 08-11-2000, 23 y.o.   MRN: [id number]  HPI  Patient is a 23 y.o. G83P0010 female who presents for foul vaginal discharge for 2 day(s). Has significant pelvic, patient states that she is pregnant. denies dysuria. Patient has a history of known exposure to STD.   Objective:    BP 118/79   Pulse (!) 101   Wt 136 lb 6.4 oz (61.9 kg)   LMP 01/23/2024 (Exact Date)   BMI 23.41 kg/m    No results found for any visits on 02/23/24.  Assessment:   1. Vaginal odor     rule out GC or chlamydia  Plan:   GC and chlamydia DNA  probe sent to lab. Treatment: abstain from coitus during course of treatment ROV prn if symptoms persist or worsen.   Mathis LITTIE Getting, CMA

## 2024-02-24 ENCOUNTER — Other Ambulatory Visit: Payer: Self-pay

## 2024-02-24 LAB — CERVICOVAGINAL ANCILLARY ONLY
Bacterial Vaginitis (gardnerella): NEGATIVE
Candida Glabrata: NEGATIVE
Candida Vaginitis: POSITIVE — AB
Chlamydia: NEGATIVE
Comment: NEGATIVE
Comment: NEGATIVE
Comment: NEGATIVE
Comment: NEGATIVE
Comment: NEGATIVE
Comment: NORMAL
Neisseria Gonorrhea: NEGATIVE
Trichomonas: NEGATIVE

## 2024-02-24 MED ORDER — FLUCONAZOLE 150 MG PO TABS: 150.0000 mg | ORAL_TABLET | Freq: Once | ORAL | 0 refills | Status: AC

## 2024-02-27 ENCOUNTER — Encounter: Payer: Self-pay | Admitting: Certified Nurse Midwife

## 2024-02-27 ENCOUNTER — Other Ambulatory Visit: Payer: Self-pay | Admitting: Certified Nurse Midwife

## 2024-02-27 MED ORDER — FLUCONAZOLE 150 MG PO TABS: 150.0000 mg | ORAL_TABLET | Freq: Once | ORAL | 1 refills | Status: AC

## 2024-03-19 ENCOUNTER — Telehealth: Payer: Self-pay | Admitting: Certified Nurse Midwife

## 2024-03-19 ENCOUNTER — Telehealth

## 2024-03-19 NOTE — Telephone Encounter (Signed)
 Reached out to pt to reschedule NOB Nurse Intake appt that was scheduled on 03/19/2024 at 3:15.  Could not leave a message bc call could not be completed.

## 2024-03-20 ENCOUNTER — Encounter: Payer: Self-pay | Admitting: Certified Nurse Midwife

## 2024-03-20 NOTE — Telephone Encounter (Signed)
 Reached out to pt (2x) to reschedule NOB Nurse Intake appt that was scheduled on 03/19/2024 at 3:15.  Could not leave a message bc call could not be completed.  Called both listed numbers.  Will send a MyChart letter to pt.

## 2024-04-02 ENCOUNTER — Other Ambulatory Visit

## 2024-04-13 ENCOUNTER — Ambulatory Visit

## 2024-04-13 ENCOUNTER — Other Ambulatory Visit: Payer: Self-pay

## 2024-04-13 VITALS — BP 115/71 | HR 88 | Ht 64.0 in | Wt 133.0 lb

## 2024-04-13 DIAGNOSIS — Z3A11 11 weeks gestation of pregnancy: Secondary | ICD-10-CM | POA: Diagnosis not present

## 2024-04-13 DIAGNOSIS — O3680X0 Pregnancy with inconclusive fetal viability, not applicable or unspecified: Secondary | ICD-10-CM | POA: Diagnosis not present

## 2024-04-13 DIAGNOSIS — Z3480 Encounter for supervision of other normal pregnancy, unspecified trimester: Secondary | ICD-10-CM

## 2024-04-13 NOTE — Progress Notes (Signed)
 Dating scan order placed for 1015 U/S today.

## 2024-04-13 NOTE — Progress Notes (Signed)
 New OB Intake  I explained I am completing New OB Intake today. We discussed her EDD of 10/29/24 that is based on LMP of 01/23/24. Pt is G2/P0010. I reviewed her allergies, medications, Medical/Surgical/OB history, and appropriate screenings. There are cats in the home: no. Based on history, this is a/an pregnancy uncomplicated . Her obstetrical history is significant for NA.  Patient Active Problem List   Diagnosis Date Noted   Supervision of other normal pregnancy, antepartum 04/13/2024    Concerns addressed today: No Concern   Delivery Plans:  Plans to deliver at [facility].  Anatomy US  Explained first scheduled US  will be 04/13/24. Anatomy US  will be scheduled around [redacted] weeks gestational age.  Labs Discussed genetic screening with patient. Patient consents to genetic testing to be drawn at new OB visit. Discussed possible labs to be drawn at new OB appointment.  COVID Vaccine Patient has not had COVID vaccine.   Social Determinants of Health Food Insecurity: denies food insecurity WIC Referral: Patient is interested in referral to [facility].  Transportation: Patient denies transportation needs. Childcare: Discussed no children allowed at ultrasound appointments.   First visit review I reviewed new OB appt with pt. I explained she will have blood work and pap smear/pelvic exam if indicated. Explained pt will be seen by Slater Rains, CNM at first visit; encounter routed to appropriate provider.   Mathis LITTIE Getting, CMA 04/13/2024  11:19 AM

## 2024-04-13 NOTE — Patient Instructions (Signed)
 First Trimester of Pregnancy  The first trimester of pregnancy starts on the first day of your last monthly period until the end of week 13. This is months 1 through 3 of pregnancy. A week after a sperm fertilizes an egg, the egg will implant into the wall of the uterus and begin to develop into a baby. Body changes during your first trimester Your body goes through many changes during pregnancy. The changes usually return to normal after your baby is born. Physical changes Your breasts may grow larger and may hurt. The area around your nipples may get darker. Your periods will stop. Your hair and nails may grow faster. You may pee more often. Health changes You may tire easily. Your gums may bleed and may be sensitive when you brush and floss. You may not feel hungry. You may have heartburn. You may throw up or feel like you may throw up. You may want to eat some foods, but not others. You may have headaches. You may have trouble pooping (constipation). Other changes Your emotions may change from day to day. You may have more dreams. Follow these instructions at home: Medicines Talk to your health care provider if you're taking medicines. Ask if the medicines are safe to take during pregnancy. Your provider may change the medicines that you take. Do not take any medicines unless told to by your provider. Take a prenatal vitamin that has at least 600 micrograms (mcg) of folic acid. Do not use herbal medicines, illegal substances, or medicines that are not approved by your provider. Eating and drinking While you're pregnant your body needs extra food for your growing baby. Talk with your provider about what to eat while pregnant. Activity Most women are able to exercise during pregnancy. Exercises may need to change as your pregnancy goes on. Talk to your provider about your activities and exercise routines. Relieving pain and discomfort Wear a good, supportive bra if your breasts  hurt. Rest with your legs raised if you have leg cramps or low back pain. Safety Wear your seatbelt at all times when you're in a car. Talk to your provider if someone hits you, hurts you, or yells at you. Talk with your provider if you're feeling sad or have thoughts of hurting yourself. Lifestyle Certain things can be harmful while you're pregnant. Follow these rules: Do not use hot tubs, steam rooms, or saunas. Do not douche. Do not use tampons or scented pads. Do not drink alcohol,smoke, vape, or use products with nicotine or tobacco in them. If you need help quitting, talk with your provider. Avoid cat litter boxes and soil used by cats. These things carry germs that can cause harm to your pregnancy and your baby. General instructions Keep all follow-up visits. It helps you and your unborn baby stay as healthy as possible. Write down your questions. Take them to your visits. Your provider will: Talk with you about your overall health. Give you advice or refer you to specialists who can help with different needs, including: Prenatal education classes. Mental health and counseling. Foods and healthy eating. Ask for help if you need help with food. Call your dentist and ask to be seen. Brush your teeth with a soft toothbrush. Floss gently. Where to find more information American Pregnancy Association: americanpregnancy.org [employer]: acog.org Office on [employer] Health: [employer] Contact a health care provider if: You feel dizzy, faint, or have a fever. You vomit or have watery poop (diarrhea) for 2  days or more. You have abnormal discharge or bleeding from your vagina. You have pain when you pee or your pee smells bad. You have cramps, pain, or pressure in your belly area. Get help right away if: You have trouble breathing or chest pain. You have any kind of injury, such as from a fall or a car crash. These symptoms may be an  emergency. Get help right away. Call 911. Do not wait to see if the symptoms will go away. Do not drive yourself to the hospital. This information is not intended to replace advice given to you by your health care provider. Make sure you discuss any questions you have with your health care provider. Document Revised: 01/06/2023 Document Reviewed: 08/06/2022 Elsevier Patient Education  2024 Elsevier Inc.   Common Medications Safe in Pregnancy  Acne:      Constipation:  Benzoyl Peroxide     Colace  Clindamycin      Dulcolax Suppository  Topica Erythromycin     Fibercon  Salicylic Acid      Metamucil         Miralax AVOID:        Senakot   Accutane    Cough:  Retin-A       Cough Drops  Tetracycline      Phenergan w/ Codeine if Rx  Minocycline      Robitussin (Plain & DM)  Antibiotics:     Crabs/Lice:  Ceclor       RID  Cephalosporins    AVOID:  E-Mycins      Kwell  Keflex  Macrobid/Macrodantin   Diarrhea:  Penicillin      Kao-Pectate  Zithromax      Imodium AD         PUSH FLUIDS AVOID:       Cipro     Fever:  Tetracycline      Tylenol (Regular or Extra  Minocycline       Strength)  Levaquin      Extra Strength-Do not          Exceed 8 tabs/24 hrs Caffeine:        200mg /day (equiv. To 1 cup of coffee or  approx. 3 12 oz sodas)         Gas: Cold/Hayfever:       Gas-X  Benadryl      Mylicon  Claritin       Phazyme  **Claritin-D        Chlor-Trimeton    Headaches:  Dimetapp      ASA-Free Excedrin  Drixoral-Non-Drowsy     Cold Compress  Mucinex (Guaifenasin)     Tylenol (Regular or Extra  Sudafed/Sudafed-12 Hour     Strength)  **Sudafed PE Pseudoephedrine   Tylenol Cold & Sinus     Vicks Vapor Rub  Zyrtec  **AVOID if Problems With Blood Pressure         Heartburn: Avoid lying down for at least 1 hour after meals  Aciphex      Maalox     Rash:  Milk of Magnesia     Benadryl    Mylanta       1% Hydrocortisone Cream  Pepcid  Pepcid Complete   Sleep  Aids:  Prevacid      Ambien   Prilosec       Benadryl  Rolaids       Chamomile Tea  Tums (Limit 4/day)     Unisom  Tylenol PM         Warm milk-add vanilla or  Hemorrhoids:       Sugar for taste  Anusol/Anusol H.C.  (RX: Analapram 2.5%)  Sugar Substitutes:  Hydrocortisone OTC     Ok in moderation  Preparation H      Tucks        Vaseline lotion applied to tissue with wiping    Herpes:     Throat:  Acyclovir      Oragel  Famvir  Valtrex     Vaccines:         Flu Shot Leg Cramps:       *Gardasil  Benadryl      Hepatitis A         Hepatitis B Nasal Spray:       Pneumovax  Saline Nasal Spray     Polio Booster         Tetanus Nausea:       Tuberculosis test or PPD  Vitamin B6 25 mg TID   AVOID:    Dramamine      *Gardasil  Emetrol       Live Poliovirus  Ginger Root 250 mg QID    MMR (measles, mumps &  High Complex Carbs @ Bedtime    rebella)  Sea Bands-Accupressure    Varicella (Chickenpox)  Unisom 1/2 tab TID     *No known complications           If received before Pain:         Known pregnancy;   Darvocet       Resume series after  Lortab        Delivery  Percocet    Yeast:   Tramadol      Femstat  Tylenol 3      Gyne-lotrimin  Ultram       Monistat  Vicodin           MISC:         All Sunscreens           Hair Coloring/highlights          Insect Repellant's          (Including DEET)         Mystic Tans   Commonly Asked Questions During Pregnancy   Cats: A parasite can be excreted in cat feces.  To avoid exposure you need to have another person empty the little box.  If you must empty the litter box you will need to wear gloves.  Wash your hands after handling your cat.  This parasite can also be found in raw or undercooked meat so this should also be avoided.  Colds, Sore Throats, Flu: Please check your medication sheet to see what you can take for symptoms.  If your symptoms are unrelieved by these medications please call the office.  Dental Work: Most  any dental work Agricultural consultant recommends is permitted.  X-rays should only be taken during the first trimester if absolutely necessary.  Your abdomen should be shielded with a lead apron during all x-rays.  Please notify your provider prior to receiving any x-rays.  Novocaine is fine; gas is not recommended.  If your dentist requires a note from [place] prior to dental work please call the office and we will provide one for you.  Exercise: Exercise is an important part of staying healthy during your pregnancy.  You may continue most exercises you were accustomed to prior to pregnancy.  Later in your pregnancy you will most likely notice you have difficulty with activities requiring balance like riding a bicycle.  It is important that you listen to your body and avoid activities that put you at a higher risk of falling.  Adequate rest and staying well hydrated are a must!  If you have questions about the safety of specific activities ask your provider.    Exposure to Children with illness: Try to avoid obvious exposure; report any symptoms to [place] when noted,  If you have chicken pos, red measles or mumps, you should be immune to these diseases.   Please do not take any vaccines while pregnant unless you have checked with your OB provider.  Fetal Movement: After 28 weeks we recommend you do "kick counts" twice daily.  Lie or sit down in a calm quiet environment and count your baby movements "kicks".  You should feel your baby at least 10 times per hour.  If you have not felt 10 kicks within the first hour get up, walk around and have something sweet to eat or drink then repeat for an additional hour.  If count remains less than 10 per hour notify your provider.  Fumigating: Follow your pest control agent's advice as to how long to stay out of your home.  Ventilate the area well before re-entering.  Hemorrhoids:   Most over-the-counter preparations can be used during pregnancy.  Check your medication to see what is  safe to use.  It is important to use a stool softener or fiber in your diet and to drink lots of liquids.  If hemorrhoids seem to be getting worse please call the office.   Hot Tubs:  Hot tubs Jacuzzis and saunas are not recommended while pregnant.  These increase your internal body temperature and should be avoided.  Intercourse:  Sexual intercourse is safe during pregnancy as long as you are comfortable, unless otherwise advised by your provider.  Spotting may occur after intercourse; report any bright red bleeding that is heavier than spotting.  Labor:  If you know that you are in labor, please go to the hospital.  If you are unsure, please call the office and let us help you decide what to do.  Lifting, straining, etc:  If your job requires heavy lifting or straining please check with your provider for any limitations.  Generally, you should not lift items heavier than that you can lift simply with your hands and arms (no back muscles)  Painting:  Paint fumes do not harm your pregnancy, but may make you ill and should be avoided if possible.  Latex or water based paints have less odor than oils.  Use adequate ventilation while painting.  Permanents & Hair Color:  Chemicals in hair dyes are not recommended as they cause increase hair dryness which can increase hair loss during pregnancy.  " Highlighting" and permanents are allowed.  Dye may be absorbed differently and permanents may not hold as well during pregnancy.  Sunbathing:  Use a sunscreen, as skin burns easily during pregnancy.  Drink plenty of fluids; avoid over heating.  Tanning Beds:  Because their possible side effects are still unknown, tanning beds are not recommended.  Ultrasound Scans:  Routine ultrasounds are performed at approximately 20 weeks.  You will be able to see your baby's general anatomy an if you would like to know the gender this can usually be determined as well.  If it is questionable when you conceived you may  also  receive an ultrasound early in your pregnancy for dating purposes.  Otherwise ultrasound exams are not routinely performed unless there is a medical necessity.  Although you can request a scan we ask that you pay for it when conducted because insurance does not cover " patient request" scans.  Work: If your pregnancy proceeds without complications you may work until your due date, unless your physician or employer advises otherwise.  Round Ligament Pain/Pelvic Discomfort:  Sharp, shooting pains not associated with bleeding are fairly common, usually occurring in the second trimester of pregnancy.  They tend to be worse when standing up or when you remain standing for long periods of time.  These are the result of pressure of certain pelvic ligaments called "round ligaments".  Rest, Tylenol and heat seem to be the most effective relief.  As the womb and fetus grow, they rise out of the pelvis and the discomfort improves.  Please notify the office if your pain seems different than that described.  It may represent a more serious condition.

## 2024-04-16 ENCOUNTER — Encounter: Admitting: Certified Nurse Midwife

## 2024-04-20 ENCOUNTER — Encounter: Payer: Self-pay | Admitting: Advanced Practice Midwife

## 2024-04-20 ENCOUNTER — Other Ambulatory Visit (HOSPITAL_COMMUNITY)
Admission: RE | Admit: 2024-04-20 | Discharge: 2024-04-20 | Disposition: A | Discharge status: Home / Self Care | Source: Ambulatory Visit | Attending: Advanced Practice Midwife | Admitting: Advanced Practice Midwife

## 2024-04-20 ENCOUNTER — Ambulatory Visit (INDEPENDENT_AMBULATORY_CARE_PROVIDER_SITE_OTHER): Admitting: Advanced Practice Midwife

## 2024-04-20 VITALS — BP 110/77 | HR 79 | Wt 133.2 lb

## 2024-04-20 DIAGNOSIS — Z0283 Encounter for blood-alcohol and blood-drug test: Secondary | ICD-10-CM

## 2024-04-20 DIAGNOSIS — Z3A12 12 weeks gestation of pregnancy: Secondary | ICD-10-CM

## 2024-04-20 DIAGNOSIS — Z130 Encounter for screening for diseases of the blood and blood-forming organs and certain disorders involving the immune mechanism: Secondary | ICD-10-CM

## 2024-04-20 DIAGNOSIS — Z0184 Encounter for antibody response examination: Secondary | ICD-10-CM

## 2024-04-20 DIAGNOSIS — Z3480 Encounter for supervision of other normal pregnancy, unspecified trimester: Secondary | ICD-10-CM

## 2024-04-20 DIAGNOSIS — Z124 Encounter for screening for malignant neoplasm of cervix: Secondary | ICD-10-CM | POA: Insufficient documentation

## 2024-04-20 DIAGNOSIS — Z3481 Encounter for supervision of other normal pregnancy, first trimester: Secondary | ICD-10-CM

## 2024-04-20 DIAGNOSIS — T7589XA Other specified effects of external causes, initial encounter: Secondary | ICD-10-CM

## 2024-04-20 DIAGNOSIS — Z113 Encounter for screening for infections with a predominantly sexual mode of transmission: Secondary | ICD-10-CM | POA: Insufficient documentation

## 2024-04-20 DIAGNOSIS — Z1379 Encounter for other screening for genetic and chromosomal anomalies: Secondary | ICD-10-CM

## 2024-04-20 DIAGNOSIS — Z3401 Encounter for supervision of normal first pregnancy, first trimester: Secondary | ICD-10-CM | POA: Diagnosis present
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
## 2024-04-21 LAB — URINALYSIS, ROUTINE W REFLEX MICROSCOPIC
Bilirubin, UA: NEGATIVE
Glucose, UA: NEGATIVE
Ketones, UA: NEGATIVE
Leukocytes,UA: NEGATIVE
Nitrite, UA: NEGATIVE
RBC, UA: NEGATIVE
Specific Gravity, UA: 1.02 (ref 1.005–1.030)
Urobilinogen, Ur: 1 mg/dL (ref 0.2–1.0)
pH, UA: 7 (ref 5.0–7.5)

## 2024-04-22 LAB — CBC/D/PLT+RPR+RH+ABO+RUBIGG...
Antibody Screen: NEGATIVE
Basophils Absolute: 0 x10E3/uL (ref 0.0–0.2)
Basos: 0 %
EOS (ABSOLUTE): 0 x10E3/uL (ref 0.0–0.4)
Eos: 0 %
HCV Ab: NONREACTIVE
HIV Screen 4th Generation wRfx: NONREACTIVE
Hematocrit: 38.3 % (ref 34.0–46.6)
Hemoglobin: 12.5 g/dL (ref 11.1–15.9)
Hepatitis B Surface Ag: NEGATIVE
Immature Grans (Abs): 0 x10E3/uL (ref 0.0–0.1)
Immature Granulocytes: 0 %
Lymphocytes Absolute: 3 x10E3/uL (ref 0.7–3.1)
Lymphs: 28 %
MCH: 30.4 pg (ref 26.6–33.0)
MCHC: 32.6 g/dL (ref 31.5–35.7)
MCV: 93 fL (ref 79–97)
Monocytes Absolute: 0.7 x10E3/uL (ref 0.1–0.9)
Monocytes: 7 %
Neutrophils Absolute: 7 x10E3/uL (ref 1.4–7.0)
Neutrophils: 65 %
Platelets: 399 x10E3/uL (ref 150–450)
RBC: 4.11 x10E6/uL (ref 3.77–5.28)
RDW: 13.2 % (ref 11.7–15.4)
RPR Ser Ql: NONREACTIVE
Rh Factor: POSITIVE
Rubella Antibodies, IGG: 9.83 {index}
Varicella zoster IgG: REACTIVE
WBC: 10.8 x10E3/uL (ref 3.4–10.8)

## 2024-04-22 LAB — HCV INTERPRETATION

## 2024-04-22 LAB — SICKLE CELL SCREEN: Sickle Cell Screen: NEGATIVE

## 2024-04-24 LAB — MONITOR DRUG PROFILE 14(MW)
Amphetamine Scrn, Ur: NEGATIVE ng/mL
BARBITURATE SCREEN URINE: NEGATIVE ng/mL
BENZODIAZEPINE SCREEN, URINE: NEGATIVE ng/mL
Buprenorphine, Urine: NEGATIVE ng/mL
CANNABINOIDS UR QL SCN: NEGATIVE ng/mL
Cocaine (Metab) Scrn, Ur: NEGATIVE ng/mL
Creatinine(Crt), U: 158.5 mg/dL (ref 20.0–300.0)
Fentanyl, Urine: NEGATIVE pg/mL
Meperidine Screen, Urine: NEGATIVE ng/mL
Methadone Screen, Urine: NEGATIVE ng/mL
OXYCODONE+OXYMORPHONE UR QL SCN: NEGATIVE ng/mL
Opiate Scrn, Ur: NEGATIVE ng/mL
Ph of Urine: 6.5 (ref 4.5–8.9)
Phencyclidine Qn, Ur: NEGATIVE ng/mL
Propoxyphene Scrn, Ur: NEGATIVE ng/mL
SPECIFIC GRAVITY: 1.02
Tramadol Screen, Urine: NEGATIVE ng/mL

## 2024-04-24 LAB — NICOTINE SCREEN, URINE: Cotinine Ql Scrn, Ur: NEGATIVE ng/mL

## 2024-04-24 LAB — URINE CULTURE, OB REFLEX

## 2024-04-24 LAB — CULTURE, OB URINE

## 2024-04-26 ENCOUNTER — Ambulatory Visit: Payer: Self-pay | Admitting: Advanced Practice Midwife

## 2024-04-26 LAB — CYTOLOGY - PAP
Chlamydia: NEGATIVE
Comment: NEGATIVE
Comment: NEGATIVE
Comment: NEGATIVE
Comment: NORMAL
Diagnosis: UNDETERMINED — AB
High risk HPV: NEGATIVE
Neisseria Gonorrhea: NEGATIVE
Trichomonas: NEGATIVE

## 2024-04-29 LAB — PANORAMA PRENATAL TEST FULL PANEL:PANORAMA TEST PLUS 5 ADDITIONAL MICRODELETIONS: FETAL FRACTION: 8.9

## 2024-04-30 ENCOUNTER — Other Ambulatory Visit: Payer: Self-pay

## 2024-04-30 ENCOUNTER — Inpatient Hospital Stay (HOSPITAL_COMMUNITY)
Admission: AD | Admit: 2024-04-30 | Discharge: 2024-04-30 | Disposition: A | Discharge status: Home / Self Care | Attending: Obstetrics and Gynecology | Admitting: Obstetrics and Gynecology

## 2024-04-30 DIAGNOSIS — O219 Vomiting of pregnancy, unspecified: Secondary | ICD-10-CM

## 2024-04-30 DIAGNOSIS — F332 Major depressive disorder, recurrent severe without psychotic features: Secondary | ICD-10-CM | POA: Insufficient documentation

## 2024-04-30 DIAGNOSIS — Z3A14 14 weeks gestation of pregnancy: Secondary | ICD-10-CM | POA: Diagnosis not present

## 2024-04-30 DIAGNOSIS — Z3480 Encounter for supervision of other normal pregnancy, unspecified trimester: Secondary | ICD-10-CM

## 2024-04-30 LAB — URINALYSIS, ROUTINE W REFLEX MICROSCOPIC
Bilirubin Urine: NEGATIVE
Glucose, UA: NEGATIVE mg/dL
Hgb urine dipstick: NEGATIVE
Ketones, ur: 20 mg/dL — AB
Nitrite: NEGATIVE
Protein, ur: 30 mg/dL — AB
Specific Gravity, Urine: 1.016 (ref 1.005–1.030)
pH: 7 (ref 5.0–8.0)

## 2024-04-30 MED ORDER — ONDANSETRON 4 MG PO TBDP
4.0000 mg | ORAL_TABLET | Freq: Once | ORAL | Status: AC
  Administered 2024-04-30: 4 mg via ORAL
  Filled 2024-04-30: qty 1

## 2024-04-30 MED ORDER — ONDANSETRON 4 MG PO TBDP: 4.0000 mg | ORAL_TABLET | Freq: Four times a day (QID) | ORAL | 2 refills | Status: AC | PRN

## 2024-04-30 NOTE — MAU Note (Signed)
 Kathleen Jenkins is a 24 y.o. at [redacted]w[redacted]d here in MAU via EMS reporting: she has a HA, abdominal and back pain.  Reports the abdominal pain and HA began yesterday and the back pain a few hours ago.  Reports she hasn't taken any meds other than Tums.  Reports she has N/V, unable to keep anything down and has vomited 5x in past 24 hours.  Denies VB.  LMP: 01/23/2024 Onset of complaint: yesterday Pain score: 6 abdomen, 8 back, 4 HA Vitals:   04/30/24 1235  BP: 104/65  Pulse: 93  Resp: 18  Temp: 98.3 F (36.8 C)  SpO2: 99%     FHT: 166 bpm  Lab orders placed from triage: UA
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
## 2024-05-01 ENCOUNTER — Ambulatory Visit

## 2024-05-02 ENCOUNTER — Telehealth: Payer: Self-pay

## 2024-05-02 NOTE — Telephone Encounter (Signed)
 Patient calling triage with concerns of vaginal itching, she thinks she may have BV or yeast. Had nurse appointment on 05/01/24 and no showed. Advised she needs to be tested so we can know if its BV or yeast so she can be treated properly. She has transportation issues. I advised OTC monistat and she thinks she can make it to pharmacy. Will follow up as needed.

## 2024-05-03 ENCOUNTER — Other Ambulatory Visit: Payer: Self-pay

## 2024-05-03 ENCOUNTER — Emergency Department (HOSPITAL_BASED_OUTPATIENT_CLINIC_OR_DEPARTMENT_OTHER)
Admission: EM | Admit: 2024-05-03 | Discharge: 2024-05-03 | Disposition: A | Discharge status: Home / Self Care | Attending: Emergency Medicine | Admitting: Emergency Medicine

## 2024-05-03 ENCOUNTER — Encounter (HOSPITAL_BASED_OUTPATIENT_CLINIC_OR_DEPARTMENT_OTHER): Payer: Self-pay

## 2024-05-03 DIAGNOSIS — N898 Other specified noninflammatory disorders of vagina: Secondary | ICD-10-CM | POA: Diagnosis present

## 2024-05-03 LAB — WET PREP, GENITAL
Clue Cells Wet Prep HPF POC: NONE SEEN
Sperm: NONE SEEN
Trich, Wet Prep: NONE SEEN
WBC, Wet Prep HPF POC: >=10 — AB
Yeast Wet Prep HPF POC: NONE SEEN

## 2024-05-03 LAB — URINALYSIS, ROUTINE W REFLEX MICROSCOPIC
Bilirubin Urine: NEGATIVE
Glucose, UA: NEGATIVE mg/dL
Hgb urine dipstick: NEGATIVE
Ketones, ur: NEGATIVE mg/dL
Nitrite: NEGATIVE
Protein, ur: NEGATIVE mg/dL
Specific Gravity, Urine: 1.014 (ref 1.005–1.030)
pH: 7 (ref 5.0–8.0)

## 2024-05-03 NOTE — Discharge Instructions (Signed)
 Your exam is normal tonight.  The wet prep does not show any sign of yeast or vaginal infection.  You have test pending for gonorrhea and chlamydia.  Follow-up with your gynecologist as scheduled return if any problems

## 2024-05-03 NOTE — ED Triage Notes (Signed)
 Pt arrives with c/o vaginal itching that started about 4 days ago. Pt reports white malodorous vaginal discharge and urine frequency. Pt is currently [redacted] weeks pregnant. Pt denies ABD pain or vaginal  bleeding. Pt concerned for BV or yeast infection.

## 2024-05-03 NOTE — ED Provider Notes (Signed)
 " [place] EMERGENCY DEPARTMENT AT [facility] [place] Provider Note   CSN: [id number] Arrival date & time: 05/03/24  8091     Patient presents with: Vaginal Itching   Kathleen Jenkins is a 24 y.o. female.   Patient complains of a vaginal odor.  Patient is requesting testing to see if she has yeast or a bacterial vaginitis.  Patient states that she has not had any burning with urination she has not had any abdominal pain.  Patient has not noticed any unusual discharge.  Patient is [redacted] weeks pregnant she has had normal prenatal care without any complications.  Patient denies any fever or chills she is not having any abdominal pain  The history is provided by the patient. No language interpreter was used.  Vaginal Itching This is a new problem.       Prior to Admission medications  Medication Sig Start Date End Date Taking? Authorizing Provider  ondansetron  (ZOFRAN -ODT) 4 MG disintegrating tablet Take 1 tablet (4 mg total) by mouth every 6 (six) hours as needed for nausea. 04/30/24   Lola Donnice HERO, MD  Prenatal Vit-Fe Fumarate-FA (MULTIVITAMIN-PRENATAL) 27-0.8 MG TABS tablet Take 1 tablet by mouth daily at 12 noon.    [provider]    Allergies: Patient has no known allergies.    Review of Systems  All other systems reviewed and are negative.   Updated Vital Signs BP 114/79 (BP Location: Right Arm)   Pulse 72   Temp 98.2 F (36.8 C) (Oral)   Resp 18   Ht 5' 4 (1.626 m)   Wt 58.5 kg   LMP 01/23/2024 (Exact Date)   SpO2 90%   BMI 22.14 kg/m   Physical Exam Vitals and nursing note reviewed.  Constitutional:      Appearance: She is well-developed.  HENT:     Jenkins: Normocephalic.  Cardiovascular:     Rate and Rhythm: Normal rate.  Pulmonary:     Effort: Pulmonary effort is normal.  Abdominal:     General: There is no distension.  Genitourinary:    General: Normal vulva.     Vagina: No vaginal discharge.  Musculoskeletal:        General: Normal  range of motion.     Cervical back: Normal range of motion.  Skin:    General: Skin is warm.  Neurological:     General: No focal deficit present.     Mental Status: She is alert and oriented to person, place, and time.     (all labs ordered are listed, but only abnormal results are displayed) Labs Reviewed  WET PREP, GENITAL - Abnormal; Notable for the following components:      Result Value   WBC, Wet Prep HPF POC >=10 (*)    All other components within normal limits  URINALYSIS, ROUTINE W REFLEX MICROSCOPIC - Abnormal; Notable for the following components:   APPearance HAZY (*)    Leukocytes,Ua TRACE (*)    Bacteria, UA FEW (*)    All other components within normal limits  GC/CHLAMYDIA PROBE AMP ([place]) NOT AT [facility]    EKG: None  Radiology: No results found.   Procedures   Medications Ordered in the ED - No data to display                                  Medical Decision Making Patient reports she has noticed a vaginal odor.  Patient is [redacted] weeks pregnant she has not had any pregnancy complications  Amount and/or Complexity of Data Reviewed Labs: ordered.    Details: Wet prep shows white blood cells, no yeast no trichomoniasis, UA is negative  Risk Risk Details: GC and Chlamydia are pending.  Patient has a normal exam overall.  Patient advised to monitor symptoms she is advised to return if symptoms worsen or change.  Patient is discharged in stable condition        Final diagnoses:  Vaginal odor    ED Discharge Orders     None       An After Visit Summary was printed and given to the patient.    Dwight Burdo K, PA-C 05/03/24 2146    Ruthe Cornet, DO 05/03/24 2216  "

## 2024-05-04 ENCOUNTER — Encounter: Payer: Self-pay | Admitting: Advanced Practice Midwife

## 2024-05-04 LAB — GC/CHLAMYDIA PROBE AMP (~~LOC~~) NOT AT ARMC
Chlamydia: NEGATIVE
Comment: NEGATIVE
Comment: NORMAL
Neisseria Gonorrhea: NEGATIVE

## 2024-05-18 ENCOUNTER — Encounter: Admitting: Advanced Practice Midwife

## 2024-05-18 DIAGNOSIS — Z3A16 16 weeks gestation of pregnancy: Secondary | ICD-10-CM

## 2024-05-18 DIAGNOSIS — Z1379 Encounter for other screening for genetic and chromosomal anomalies: Secondary | ICD-10-CM

## 2024-05-18 DIAGNOSIS — Z3689 Encounter for other specified antenatal screening: Secondary | ICD-10-CM

## 2024-05-18 DIAGNOSIS — Z3480 Encounter for supervision of other normal pregnancy, unspecified trimester: Secondary | ICD-10-CM

## 2024-05-23 ENCOUNTER — Ambulatory Visit: Admitting: Certified Nurse Midwife

## 2024-05-23 VITALS — BP 103/63 | HR 81 | Wt 137.6 lb

## 2024-05-23 DIAGNOSIS — Z3480 Encounter for supervision of other normal pregnancy, unspecified trimester: Secondary | ICD-10-CM

## 2024-05-23 NOTE — Progress Notes (Signed)
" ° ° °  Return Prenatal Note   Subjective   24 y.o. G2P0010 at [redacted]w[redacted]d presents for this follow-up prenatal visit.  Patient is doing well. She reports good fetal movement and consents to AFP today. Patient reports: Movement: Absent  Objective   Flow sheet Vitals: Pulse Rate: 81 BP: 103/63 Fetal Heart Rate (bpm): 155 Total weight gain: 1 lb 9.6 oz (0.726 kg)  General Appearance  No acute distress, well appearing, and well nourished Pulmonary   Normal work of breathing Neurologic   Alert and oriented to person, place, and time Psychiatric   Mood and affect within normal limits   Assessment/Plan   Plan  24 y.o. G2P0010 at [redacted]w[redacted]d presents for follow-up OB visit. Reviewed prenatal record including previous visit note.  Supervision of other normal pregnancy, antepartum Reviewed needing a bit more nutrition and hydration.  Anatomy US  ordered. Reviewed red flag warning signs anticipatory guidance for upcoming prenatal care.        Future Appointments  Date Time Provider Department Center  06/20/2024  8:15 AM [facility] US  1 [facility] None  06/20/2024  9:35 AM Sebastian Sham, CNM [facility] None    For next visit:  continue with routine prenatal care     Damien Parsley, CNM [facility]  02/04/265:03 PM "

## 2024-05-23 NOTE — Assessment & Plan Note (Addendum)
 Reviewed needing a bit more nutrition and hydration.  Anatomy US  ordered. Reviewed red flag warning signs anticipatory guidance for upcoming prenatal care.

## 2024-06-20 ENCOUNTER — Other Ambulatory Visit

## 2024-06-20 ENCOUNTER — Encounter: Admitting: Certified Nurse Midwife
